# Patient Record
Sex: Male | Born: 1980 | Race: White | Hispanic: No | Smoking: Current every day smoker
Health system: Southern US, Community
[De-identification: ages and names within clinical notes are randomized; demographics above are authoritative.]

## PROBLEM LIST (undated history)

## (undated) DIAGNOSIS — F32A Depression, unspecified: Secondary | ICD-10-CM

## (undated) DIAGNOSIS — F329 Major depressive disorder, single episode, unspecified: Secondary | ICD-10-CM

## (undated) HISTORY — PX: TONSILLECTOMY: SUR1361

## (undated) HISTORY — PX: ANKLE SURGERY: SHX546

## (undated) HISTORY — PX: SPERMATOCELECTOMY: SHX2420

---

## 2001-07-15 ENCOUNTER — Other Ambulatory Visit: Admission: RE | Admit: 2001-07-15 | Discharge: 2001-07-15 | Payer: Self-pay | Admitting: Otolaryngology

## 2001-07-15 ENCOUNTER — Encounter (INDEPENDENT_AMBULATORY_CARE_PROVIDER_SITE_OTHER): Payer: Self-pay

## 2009-08-09 ENCOUNTER — Ambulatory Visit (HOSPITAL_BASED_OUTPATIENT_CLINIC_OR_DEPARTMENT_OTHER): Admission: RE | Admit: 2009-08-09 | Discharge: 2009-08-09 | Payer: Self-pay | Admitting: Urology

## 2009-08-21 ENCOUNTER — Ambulatory Visit (HOSPITAL_BASED_OUTPATIENT_CLINIC_OR_DEPARTMENT_OTHER): Admission: RE | Admit: 2009-08-21 | Discharge: 2009-08-22 | Payer: Self-pay | Admitting: Urology

## 2009-08-25 ENCOUNTER — Ambulatory Visit: Payer: Self-pay | Admitting: Internal Medicine

## 2009-08-25 ENCOUNTER — Inpatient Hospital Stay (HOSPITAL_COMMUNITY): Admission: EM | Admit: 2009-08-25 | Discharge: 2009-08-28 | Payer: Self-pay | Admitting: Emergency Medicine

## 2009-08-26 ENCOUNTER — Encounter (INDEPENDENT_AMBULATORY_CARE_PROVIDER_SITE_OTHER): Payer: Self-pay | Admitting: Internal Medicine

## 2009-08-26 ENCOUNTER — Ambulatory Visit: Payer: Self-pay | Admitting: Surgery

## 2009-08-27 ENCOUNTER — Encounter (INDEPENDENT_AMBULATORY_CARE_PROVIDER_SITE_OTHER): Payer: Self-pay | Admitting: Internal Medicine

## 2009-09-08 ENCOUNTER — Emergency Department (HOSPITAL_COMMUNITY): Admission: EM | Admit: 2009-09-08 | Discharge: 2009-09-08 | Payer: Self-pay | Admitting: Emergency Medicine

## 2009-09-25 DIAGNOSIS — I2699 Other pulmonary embolism without acute cor pulmonale: Secondary | ICD-10-CM

## 2009-09-26 ENCOUNTER — Ambulatory Visit: Payer: Self-pay | Admitting: Internal Medicine

## 2009-12-30 ENCOUNTER — Ambulatory Visit: Payer: Self-pay | Admitting: Oncology

## 2010-10-21 NOTE — Assessment & Plan Note (Signed)
Summary: Pulmonary/ new pt eval with PE   Visit Type:  Initial Consult Copy to:  Dr Allena Katz Primary Provider/Referring Provider:  Dr Allena Katz  CC:  Hx of Bilateral PE.  History of Present Illness: 40 yowm quit smoking Nov 2010 with urologic procedure x 2 complicated by bilateral PE  September 26, 2009 cc recent PE with neg venous dopplers feeling 100% including gym work. Plan is for 6 months with 6 months of coumadin.  Pt denies any significant sore throat, dysphagia, itching, sneezing,  nasal congestion or excess secretions,  fever, chills, sweats, unintended wt loss, pleuritic or exertional cp, hempoptysis, change in activity tolerance  orthopnea pnd or leg swelling    Current Medications (verified): 1)  Coumadin 5 Mg Tabs (Warfarin Sodium) .... As Directed 2)  Iron .Marland Kitchen.. 1 Three Times A Day  Allergies (verified): No Known Drug Allergies  Past History:  Past Medical History: Hx of PE (ICD-415.19)    - See CT 08/23/2009    - Echo 08/27/2009:     1. Left ventricle: The cavity size was normal.    2. Pulmonary arteries: PA peak pressure: 39mm Hg (S).   Impressions: RV is normal. Only trivial TR with just mildly elevated pulmonary     pressures by echo.  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  Quit 08/08/09.  Smoked on and off x 3 years.  Pt states that he only smoked occ. ETOH occ  Review of Systems       The patient complains of productive cough, sore throat, and change in color of mucus.  The patient denies shortness of breath with activity, shortness of breath at rest, non-productive cough, coughing up blood, chest pain, irregular heartbeats, acid heartburn, indigestion, loss of appetite, weight change, abdominal pain, difficulty swallowing, tooth/dental problems, headaches, nasal  congestion/difficulty breathing through nose, sneezing, itching, ear ache, anxiety, depression, hand/feet swelling, joint stiffness or pain, rash, and fever.    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 As directed  Orders: Consultation Level IV (41324)  Medications Added to Medication List This Visit: 1)  Coumadin 5 Mg Tabs (Warfarin sodium) .... As directed 2)  Iron  .Marland Kitchen.. 1 three times a day  Patient Instructions: 1)  This is considered provoked blood clot and one of the  risks of having surgery 2)  Recommendation is for 6 months of coumadin and then a baby aspirin daily when you finish the coumadin 3)  No further testing is necessary except for the option to check a hypercoagulable profile once you are off the coumadin.

## 2010-12-22 LAB — DIFFERENTIAL
Basophils Absolute: 0.1 10*3/uL (ref 0.0–0.1)
Basophils Relative: 1 % (ref 0–1)
Lymphocytes Relative: 26 % (ref 12–46)
Monocytes Absolute: 0.5 10*3/uL (ref 0.1–1.0)
Monocytes Relative: 6 % (ref 3–12)
Neutro Abs: 5.1 10*3/uL (ref 1.7–7.7)
Neutrophils Relative %: 65 % (ref 43–77)

## 2010-12-22 LAB — APTT: aPTT: 34 seconds (ref 24–37)

## 2010-12-22 LAB — CBC
Hemoglobin: 13.4 g/dL (ref 13.0–17.0)
MCHC: 34.3 g/dL (ref 30.0–36.0)
RBC: 4.42 MIL/uL (ref 4.22–5.81)
RDW: 13.5 % (ref 11.5–15.5)

## 2010-12-22 LAB — PROTIME-INR: INR: 2.32 — ABNORMAL HIGH (ref 0.00–1.49)

## 2010-12-23 LAB — DIFFERENTIAL
Basophils Absolute: 0 10*3/uL (ref 0.0–0.1)
Lymphocytes Relative: 10 % — ABNORMAL LOW (ref 12–46)
Lymphs Abs: 1.2 10*3/uL (ref 0.7–4.0)
Neutro Abs: 10.2 10*3/uL — ABNORMAL HIGH (ref 1.7–7.7)
Neutrophils Relative %: 80 % — ABNORMAL HIGH (ref 43–77)

## 2010-12-23 LAB — PROTIME-INR
INR: 1.15 (ref 0.00–1.49)
INR: 1.19 (ref 0.00–1.49)
INR: 1.52 — ABNORMAL HIGH (ref 0.00–1.49)
INR: 1.71 — ABNORMAL HIGH (ref 0.00–1.49)
Prothrombin Time: 14.6 seconds (ref 11.6–15.2)
Prothrombin Time: 15 seconds (ref 11.6–15.2)
Prothrombin Time: 18.2 seconds — ABNORMAL HIGH (ref 11.6–15.2)

## 2010-12-23 LAB — CBC
HCT: 34.8 % — ABNORMAL LOW (ref 39.0–52.0)
MCV: 90 fL (ref 78.0–100.0)
Platelets: 191 10*3/uL (ref 150–400)
Platelets: 206 10*3/uL (ref 150–400)
Platelets: 208 10*3/uL (ref 150–400)
RDW: 12.5 % (ref 11.5–15.5)
RDW: 12.9 % (ref 11.5–15.5)
WBC: 11.7 10*3/uL — ABNORMAL HIGH (ref 4.0–10.5)
WBC: 11.9 10*3/uL — ABNORMAL HIGH (ref 4.0–10.5)
WBC: 12.8 10*3/uL — ABNORMAL HIGH (ref 4.0–10.5)

## 2010-12-23 LAB — APTT: aPTT: 30 seconds (ref 24–37)

## 2010-12-23 LAB — BASIC METABOLIC PANEL
BUN: 13 mg/dL (ref 6–23)
CO2: 28 mEq/L (ref 19–32)
Calcium: 8.5 mg/dL (ref 8.4–10.5)
Chloride: 103 mEq/L (ref 96–112)
Creatinine, Ser: 0.95 mg/dL (ref 0.4–1.5)
Creatinine, Ser: 0.99 mg/dL (ref 0.4–1.5)
GFR calc non Af Amer: >60 mL/min (ref >60)
GFR calc non Af Amer: >60 mL/min (ref >60)
Sodium: 136 mEq/L (ref 135–145)

## 2010-12-23 LAB — POCT HEMOGLOBIN-HEMACUE: Hemoglobin: 14.2 g/dL (ref 13.0–17.0)

## 2010-12-23 LAB — HEPARIN LEVEL (UNFRACTIONATED)

## 2011-04-06 ENCOUNTER — Ambulatory Visit (HOSPITAL_COMMUNITY)
Admission: RE | Admit: 2011-04-06 | Discharge: 2011-04-06 | Disposition: A | Discharge status: Home / Self Care | Payer: 59 | Source: Ambulatory Visit | Attending: Family Medicine | Admitting: Family Medicine

## 2011-04-06 DIAGNOSIS — M79609 Pain in unspecified limb: Secondary | ICD-10-CM | POA: Insufficient documentation

## 2011-04-06 DIAGNOSIS — M7989 Other specified soft tissue disorders: Secondary | ICD-10-CM | POA: Insufficient documentation

## 2011-04-07 ENCOUNTER — Other Ambulatory Visit (HOSPITAL_COMMUNITY): Payer: Self-pay | Admitting: Family Medicine

## 2011-04-07 DIAGNOSIS — M25571 Pain in right ankle and joints of right foot: Secondary | ICD-10-CM

## 2011-04-10 ENCOUNTER — Ambulatory Visit (HOSPITAL_COMMUNITY)
Admission: RE | Admit: 2011-04-10 | Discharge: 2011-04-10 | Disposition: A | Discharge status: Home / Self Care | Payer: 59 | Source: Ambulatory Visit | Attending: Family Medicine | Admitting: Family Medicine

## 2011-04-10 DIAGNOSIS — S93439A Sprain of tibiofibular ligament of unspecified ankle, initial encounter: Secondary | ICD-10-CM | POA: Insufficient documentation

## 2011-04-10 DIAGNOSIS — X58XXXA Exposure to other specified factors, initial encounter: Secondary | ICD-10-CM | POA: Insufficient documentation

## 2011-04-10 DIAGNOSIS — M25571 Pain in right ankle and joints of right foot: Secondary | ICD-10-CM

## 2011-04-10 DIAGNOSIS — S8253XA Displaced fracture of medial malleolus of unspecified tibia, initial encounter for closed fracture: Secondary | ICD-10-CM | POA: Insufficient documentation

## 2011-04-10 DIAGNOSIS — S93429A Sprain of deltoid ligament of unspecified ankle, initial encounter: Secondary | ICD-10-CM | POA: Insufficient documentation

## 2011-07-29 ENCOUNTER — Ambulatory Visit: Payer: 59 | Attending: Orthopedic Surgery

## 2011-07-29 DIAGNOSIS — M25676 Stiffness of unspecified foot, not elsewhere classified: Secondary | ICD-10-CM | POA: Insufficient documentation

## 2011-07-29 DIAGNOSIS — R5381 Other malaise: Secondary | ICD-10-CM | POA: Insufficient documentation

## 2011-07-29 DIAGNOSIS — M25579 Pain in unspecified ankle and joints of unspecified foot: Secondary | ICD-10-CM | POA: Insufficient documentation

## 2011-07-29 DIAGNOSIS — IMO0001 Reserved for inherently not codable concepts without codable children: Secondary | ICD-10-CM | POA: Insufficient documentation

## 2011-07-29 DIAGNOSIS — M6281 Muscle weakness (generalized): Secondary | ICD-10-CM | POA: Insufficient documentation

## 2011-07-29 DIAGNOSIS — M25673 Stiffness of unspecified ankle, not elsewhere classified: Secondary | ICD-10-CM | POA: Insufficient documentation

## 2011-07-29 DIAGNOSIS — R262 Difficulty in walking, not elsewhere classified: Secondary | ICD-10-CM | POA: Insufficient documentation

## 2011-07-30 ENCOUNTER — Ambulatory Visit: Payer: 59

## 2011-08-03 ENCOUNTER — Ambulatory Visit: Payer: 59 | Admitting: Physical Therapy

## 2011-08-06 ENCOUNTER — Ambulatory Visit: Payer: 59

## 2011-08-10 ENCOUNTER — Ambulatory Visit: Payer: 59 | Admitting: Physical Therapy

## 2011-08-17 ENCOUNTER — Ambulatory Visit: Payer: 59

## 2011-08-19 ENCOUNTER — Ambulatory Visit: Payer: 59 | Admitting: Physical Therapy

## 2011-08-24 ENCOUNTER — Ambulatory Visit: Payer: 59 | Attending: Orthopedic Surgery

## 2011-08-24 DIAGNOSIS — M25579 Pain in unspecified ankle and joints of unspecified foot: Secondary | ICD-10-CM | POA: Insufficient documentation

## 2011-08-24 DIAGNOSIS — M6281 Muscle weakness (generalized): Secondary | ICD-10-CM | POA: Insufficient documentation

## 2011-08-24 DIAGNOSIS — R5381 Other malaise: Secondary | ICD-10-CM | POA: Insufficient documentation

## 2011-08-24 DIAGNOSIS — M25676 Stiffness of unspecified foot, not elsewhere classified: Secondary | ICD-10-CM | POA: Insufficient documentation

## 2011-08-24 DIAGNOSIS — IMO0001 Reserved for inherently not codable concepts without codable children: Secondary | ICD-10-CM | POA: Insufficient documentation

## 2011-08-24 DIAGNOSIS — R262 Difficulty in walking, not elsewhere classified: Secondary | ICD-10-CM | POA: Insufficient documentation

## 2011-08-24 DIAGNOSIS — M25673 Stiffness of unspecified ankle, not elsewhere classified: Secondary | ICD-10-CM | POA: Insufficient documentation

## 2011-08-26 ENCOUNTER — Ambulatory Visit: Payer: 59

## 2012-12-20 ENCOUNTER — Other Ambulatory Visit: Payer: Self-pay | Admitting: Internal Medicine

## 2013-02-03 ENCOUNTER — Other Ambulatory Visit: Payer: Self-pay | Admitting: *Deleted

## 2013-02-03 MED ORDER — MONTELUKAST SODIUM 10 MG PO TABS: ORAL_TABLET | ORAL | Status: DC

## 2014-06-27 ENCOUNTER — Encounter (HOSPITAL_COMMUNITY): Payer: Self-pay | Admitting: Emergency Medicine

## 2014-06-27 ENCOUNTER — Emergency Department (HOSPITAL_COMMUNITY)
Admission: EM | Admit: 2014-06-27 | Discharge: 2014-06-28 | Disposition: A | Discharge status: Home / Self Care | Payer: 59 | Attending: Emergency Medicine | Admitting: Emergency Medicine

## 2014-06-27 ENCOUNTER — Ambulatory Visit (HOSPITAL_COMMUNITY)
Admission: RE | Admit: 2014-06-27 | Discharge: 2014-06-27 | Disposition: A | Discharge status: Home / Self Care | Payer: 59 | Attending: Psychiatry | Admitting: Psychiatry

## 2014-06-27 DIAGNOSIS — F332 Major depressive disorder, recurrent severe without psychotic features: Secondary | ICD-10-CM | POA: Insufficient documentation

## 2014-06-27 DIAGNOSIS — R45851 Suicidal ideations: Secondary | ICD-10-CM | POA: Diagnosis present

## 2014-06-27 HISTORY — DX: Major depressive disorder, single episode, unspecified: F32.9

## 2014-06-27 HISTORY — DX: Depression, unspecified: F32.A

## 2014-06-27 LAB — COMPREHENSIVE METABOLIC PANEL
ALK PHOS: 65 U/L (ref 39–117)
ALT: 19 U/L (ref 0–53)
ANION GAP: 11 (ref 5–15)
AST: 13 U/L (ref 0–37)
Albumin: 4.4 g/dL (ref 3.5–5.2)
BILIRUBIN TOTAL: 0.3 mg/dL (ref 0.3–1.2)
BUN: 10 mg/dL (ref 6–23)
CHLORIDE: 105 meq/L (ref 96–112)
CO2: 26 mEq/L (ref 19–32)
Calcium: 9.4 mg/dL (ref 8.4–10.5)
Creatinine, Ser: 1.13 mg/dL (ref 0.50–1.35)
GFR calc non Af Amer: 83 mL/min — ABNORMAL LOW (ref >90)
GLUCOSE: 102 mg/dL — AB (ref 70–99)
POTASSIUM: 4.2 meq/L (ref 3.7–5.3)
Sodium: 142 mEq/L (ref 137–147)
Total Protein: 7.7 g/dL (ref 6.0–8.3)

## 2014-06-27 LAB — RAPID URINE DRUG SCREEN, HOSP PERFORMED
AMPHETAMINES: NOT DETECTED
BARBITURATES: NOT DETECTED
Benzodiazepines: NOT DETECTED
Cocaine: NOT DETECTED
Opiates: NOT DETECTED
Tetrahydrocannabinol: NOT DETECTED

## 2014-06-27 LAB — CBC
HCT: 46.3 % (ref 39.0–52.0)
HEMOGLOBIN: 15.5 g/dL (ref 13.0–17.0)
MCH: 29.7 pg (ref 26.0–34.0)
MCHC: 33.5 g/dL (ref 30.0–36.0)
MCV: 88.7 fL (ref 78.0–100.0)
Platelets: 152 10*3/uL (ref 150–400)
RBC: 5.22 MIL/uL (ref 4.22–5.81)
RDW: 12.8 % (ref 11.5–15.5)
WBC: 7.1 10*3/uL (ref 4.0–10.5)

## 2014-06-27 LAB — ETHANOL: Alcohol, Ethyl (B): <11 mg/dL (ref 0–11)

## 2014-06-27 LAB — SALICYLATE LEVEL: Salicylate Lvl: <2 mg/dL — ABNORMAL LOW (ref 2.8–20.0)

## 2014-06-27 LAB — ACETAMINOPHEN LEVEL

## 2014-06-27 MED ORDER — LORAZEPAM 1 MG PO TABS: 1.0000 mg | ORAL_TABLET | Freq: Three times a day (TID) | ORAL | Status: DC | PRN

## 2014-06-27 MED ORDER — ZOLPIDEM TARTRATE 5 MG PO TABS
5.0000 mg | ORAL_TABLET | Freq: Every evening | ORAL | Status: DC | PRN
  Administered 2014-06-27: 5 mg via ORAL
  Filled 2014-06-27: qty 1

## 2014-06-27 MED ORDER — NICOTINE 21 MG/24HR TD PT24
21.0000 mg | MEDICATED_PATCH | Freq: Every day | TRANSDERMAL | Status: DC
  Administered 2014-06-27 – 2014-06-28 (×2): 21 mg via TRANSDERMAL
  Filled 2014-06-27 (×2): qty 1

## 2014-06-27 MED ORDER — ALUM & MAG HYDROXIDE-SIMETH 200-200-20 MG/5ML PO SUSP: 30.0000 mL | ORAL | Status: DC | PRN

## 2014-06-27 MED ORDER — ONDANSETRON HCL 4 MG PO TABS: 4.0000 mg | ORAL_TABLET | Freq: Three times a day (TID) | ORAL | Status: DC | PRN

## 2014-06-27 MED ORDER — TRAZODONE HCL 50 MG PO TABS
50.0000 mg | ORAL_TABLET | Freq: Every evening | ORAL | Status: DC | PRN
  Administered 2014-06-27: 50 mg via ORAL
  Filled 2014-06-27: qty 1

## 2014-06-27 MED ORDER — IBUPROFEN 200 MG PO TABS: 600.0000 mg | ORAL_TABLET | Freq: Three times a day (TID) | ORAL | Status: DC | PRN

## 2014-06-27 MED ORDER — MONTELUKAST SODIUM 10 MG PO TABS
10.0000 mg | ORAL_TABLET | Freq: Every day | ORAL | Status: DC
  Administered 2014-06-27: 10 mg via ORAL
  Filled 2014-06-27 (×2): qty 1
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
## 2014-06-27 NOTE — ED Provider Notes (Signed)
CSN: [id number]     Arrival date & time 06/27/14  1623 History   First MD Initiated Contact with Patient 06/27/14 2005     Chief Complaint  Patient presents with  . Suicidal       HPI Patient presents with severe depression and poor sleep.  He states he has suicidal thoughts.  He's never carried out any of these plans.  No drug or alcohol problems.  He is not currently on any medications for his depression.  Never been hospitalized for mental health facility.  He has no other complaints at this time.   Past Medical History  Diagnosis Date  . Depression    No past surgical history on file. No family history on file. History  Substance Use Topics  . Smoking status: Not on file  . Smokeless tobacco: Not on file  . Alcohol Use: No    Review of Systems  All other systems reviewed and are negative.     Allergies  Review of patient's allergies indicates no known allergies.  Home Medications   Prior to Admission medications   Medication Sig Start Date End Date Taking? Authorizing Provider  ibuprofen (ADVIL,MOTRIN) 200 MG tablet Take 200-800 mg by mouth every 6 (six) hours as needed for moderate pain.   Yes Historical Provider, MD  montelukast (SINGULAIR) 10 MG tablet Take 10 mg by mouth at bedtime.   Yes Historical Provider, MD   BP 139/91  Pulse 70  Temp(Src) 97.7 F (36.5 C) (Oral)  Resp 20  SpO2 100% Physical Exam  Nursing note and vitals reviewed. Constitutional: He is oriented to person, place, and time. He appears well-developed and well-nourished.  HENT:  Head: Normocephalic and atraumatic.  Eyes: EOM are normal.  Neck: Normal range of motion.  Cardiovascular: Normal rate, regular rhythm, normal heart sounds and intact distal pulses.   Pulmonary/Chest: Effort normal and breath sounds normal. No respiratory distress.  Abdominal: Soft. He exhibits no distension. There is no tenderness.  Musculoskeletal: Normal range of motion.  Neurological: He is alert and  oriented to person, place, and time.  Skin: Skin is warm and dry.  Psychiatric:  Tearful.  Suicidal ideation.    ED Course  Procedures (including critical care time) Labs Review Labs Reviewed  COMPREHENSIVE METABOLIC PANEL - Abnormal; Notable for the following:    Glucose, Bld 102 (*)    GFR calc non Af Amer 83 (*)    All other components within normal limits  SALICYLATE LEVEL - Abnormal; Notable for the following:    Salicylate Lvl <2.0 (*)    All other components within normal limits  ACETAMINOPHEN LEVEL  CBC  ETHANOL  URINE RAPID DRUG SCREEN (HOSP PERFORMED)    Imaging Review No results found.   EKG Interpretation None      MDM   Final diagnoses:  None    Medically clear.  TTS.  Placement.  Sitter at bedside.  Holding orders written    Lyanne CoKevin M Artie Mcintyre, MD 06/27/14 2027
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
## 2014-06-28 DIAGNOSIS — F332 Major depressive disorder, recurrent severe without psychotic features: Secondary | ICD-10-CM

## 2014-06-28 MED ORDER — FLUOXETINE HCL 20 MG PO CAPS: 20.0000 mg | ORAL_CAPSULE | Freq: Every day | ORAL | Status: DC

## 2014-06-28 NOTE — BH Assessment (Signed)
Inpt recommended. [facility] to the following facilities [facility]   [facility]  Alejandro Romero, [place] Triage Specialist 06/28/2014 6:24 AM

## 2014-06-28 NOTE — ED Notes (Signed)
Pt discharged to home. DC instructions given. Prescriptions x 1 given for prozac. No concerns voiced. Mom called, per request, to make aware of pt's discharge. Mom is coming in to take pt to home. Pt is resting quietly in room at the moment watching television. Waiting for mom to pick him up. Vwilliams, rn.

## 2014-06-28 NOTE — BH Assessment (Addendum)
Discharge per Dr. Kellie Shropshireaylor/Shuvon Rankin, NP with follow up to the following facility:   Psychiatrists/Therapist Appointment Time and Date: July 05, 2014 @ 5:15pm Thedore MinsMojeed Akintayo, MD [address] [place], [place] [address] [phone]

## 2014-06-28 NOTE — BH Assessment (Signed)
Discharge home per Dr. Ladona Ridgelaylor and Assunta FoundShuvon Rankin, NP pending a appointment with [facility]. Left 3 voicemails attempting to make appt. Will continue trying to reach provider in order to make appt. prior to discharge.  *[facility] [phone]

## 2014-06-28 NOTE — ED Notes (Signed)
Pt discharged to home. Left unit in good condition ambulating to checkout accompanied by mother. No concerns voiced.

## 2014-06-28 NOTE — Consult Note (Signed)
[facility] Face-to-Face Psychiatry Consult   Reason for Consult:  Suicidal ideation Referring Physician:  ER MD  Gerry Blanchfield is an 33 y.o. male. Total Time spent with patient: 45 minutes  Assessment: AXIS I:  Major Depression, Recurrent severe AXIS II:  Deferred AXIS III:   Past Medical History  Diagnosis Date  . Depression    AXIS IV:  economic problems and problems related to social environment AXIS V:  51-60 moderate symptoms  Plan:  No evidence of imminent risk to self or others at present.    Subjective:   Dhanvin Szeto is a 33 y.o. male patient admitted with suicidal ideation.  HPI:  Mr Duchemin says he is depressed and has been having suicidal thoughts.  He did make an attempt last year.  This morning he says he is still depressed but not suicidal at this moment.  He believes he will get better with outpatient therapy and medication which helped him in the past. Having time to think without distraction and also getting sleep helped clear his mind and he believes he would rather get outpatient help rather than inpatient.  His mother came in for part of the interview and she was supportive of him coming home with her.  He recently moved in with her.  The separation from his wife and relationship issues with a current girlfriend have caused stress.  He is not using drugs or alcohol.  He works second shift and has no social life and has insomnia which has been present for a long time.  He has a new job coming up in December which is more promising than his current one and says things are looking up but he just cannot shake the depression. HPI Elements:   Location:  depression. Quality:  some suicidal thoughts. Severity:  does not plan to act on these thoughts he says. Timing:  break up with his wife . Duration:  6 months. Context:  as above.  Past Psychiatric History: Past Medical History  Diagnosis Date  . Depression     reports that he does not drink alcohol or use illicit drugs. His  tobacco history is not on file. No family history on file. Family History Substance Abuse: No Family Supports: Yes, List: (mom, sisters) Living Arrangements: Parent Can pt return to current living arrangement?: Yes   Allergies:  No Known Allergies  ACT Assessment Complete:  Yes:    Educational Status    Risk to Self: Risk to self with the past 6 months Suicidal Ideation: Yes-Currently Present Suicidal Intent: No Is patient at risk for suicide?: Yes Suicidal Plan?: No (denies intent but thinks about shooting himself or OD) Access to Means: Yes Specify Access to Suicidal Means: owns several firearms, access to pills (pt also mentioned slamming tree into car) What has been your use of drugs/alcohol within the last 12 months?: none Previous Attempts/Gestures: Yes How many times?: 1 (put gun in mouth, then ingested bottle of sleeping pillsetoh) Other Self Harm Risks: none Triggers for Past Attempts: Spouse contact;Other (Comment) Intentional Self Injurious Behavior: None Family Suicide History: No (non blood cousin committed suicide) Recent stressful life event(s): Conflict (Comment);Other (Comment) (depressive sxs,divorce, current g/f won't talk to him) Persecutory voices/beliefs?: No Depression: Yes Depression Symptoms: Despondent;Insomnia;Tearfulness;Isolating;Fatigue;Loss of interest in usual pleasures;Feeling worthless/self pity Substance abuse history and/or treatment for substance abuse?: No Suicide prevention information given to non-admitted patients: Not applicable  Risk to Others: Risk to Others within the past 6 months Homicidal Ideation: No Thoughts of Harm  to Others: No Current Homicidal Intent: No Current Homicidal Plan: No Access to Homicidal Means: No Identified Victim: none History of harm to others?: No Assessment of Violence: None Noted Violent Behavior Description: pt calm - denies hx violence Does patient have access to weapons?: Yes (Comment) (pt sts owns  many firearms & concealed permit) Criminal Charges Pending?: No Does patient have a court date: No  Abuse:    Prior Inpatient Therapy: Prior Inpatient Therapy Prior Inpatient Therapy: No Prior Therapy Dates: na Prior Therapy Facilty/Provider(s): na Reason for Treatment: na  Prior Outpatient Therapy: Prior Outpatient Therapy Prior Outpatient Therapy: Yes Prior Therapy Dates: currently Prior Therapy Facilty/Provider(s): [facility] at provider in [place]? Reason for Treatment: depression, divorce  Additional Information: Additional Information 1:1 In Past 12 Months?: No CIRT Risk: No Elopement Risk: No Does patient have medical clearance?: No                  Objective: Blood pressure 107/62, pulse 73, temperature 97.8 F (36.6 C), temperature source Oral, resp. rate 18, SpO2 99.00%.There is no weight on file to calculate BMI. Results for orders placed during the hospital encounter of 06/27/14 (from the past 72 hour(s))  ACETAMINOPHEN LEVEL     Status: None   Collection Time    06/27/14  4:48 PM      Result Value Ref Range   Acetaminophen (Tylenol), Serum <15.0  10 - 30 ug/mL   Comment:            THERAPEUTIC CONCENTRATIONS VARY     SIGNIFICANTLY. A RANGE OF 10-30     ug/mL MAY BE AN EFFECTIVE     CONCENTRATION FOR MANY PATIENTS.     HOWEVER, SOME ARE BEST TREATED     AT CONCENTRATIONS OUTSIDE THIS     RANGE.     ACETAMINOPHEN CONCENTRATIONS     >150 ug/mL AT 4 HOURS AFTER     INGESTION AND >50 ug/mL AT 12     HOURS AFTER INGESTION ARE     OFTEN ASSOCIATED WITH TOXIC     REACTIONS.  CBC     Status: None   Collection Time    06/27/14  4:48 PM      Result Value Ref Range   WBC 7.1  4.0 - 10.5 K/uL   RBC 5.22  4.22 - 5.81 MIL/uL   Hemoglobin 15.5  13.0 - 17.0 g/dL   HCT 46.3  39.0 - 52.0 %   MCV 88.7  78.0 - 100.0 fL   MCH 29.7  26.0 - 34.0 pg   MCHC 33.5  30.0 - 36.0 g/dL   RDW 12.8  11.5 - 15.5 %   Platelets 152  150 - 400 K/uL   COMPREHENSIVE METABOLIC PANEL     Status: Abnormal   Collection Time    06/27/14  4:48 PM      Result Value Ref Range   Sodium 142  137 - 147 mEq/L   Potassium 4.2  3.7 - 5.3 mEq/L   Chloride 105  96 - 112 mEq/L   CO2 26  19 - 32 mEq/L   Glucose, Bld 102 (*) 70 - 99 mg/dL   BUN 10  6 - 23 mg/dL   Creatinine, Ser 1.13  0.50 - 1.35 mg/dL   Calcium 9.4  8.4 - 10.5 mg/dL   Total Protein 7.7  6.0 - 8.3 g/dL   Albumin 4.4  3.5 - 5.2 g/dL   AST 13  0 -  37 U/L   ALT 19  0 - 53 U/L   Alkaline Phosphatase 65  39 - 117 U/L   Total Bilirubin 0.3  0.3 - 1.2 mg/dL   GFR calc non Af Amer 83 (*) >90 mL/min   GFR calc Af Amer >90  >90 mL/min   Comment: (NOTE)     The eGFR has been calculated using the CKD EPI equation.     This calculation has not been validated in all clinical situations.     eGFR's persistently <90 mL/min signify possible Chronic Kidney     Disease.   Anion gap 11  5 - 15  ETHANOL     Status: None   Collection Time    06/27/14  4:48 PM      Result Value Ref Range   Alcohol, Ethyl (B) <11  0 - 11 mg/dL   Comment:            LOWEST DETECTABLE LIMIT FOR     SERUM ALCOHOL IS 11 mg/dL     FOR MEDICAL PURPOSES ONLY  SALICYLATE LEVEL     Status: Abnormal   Collection Time    06/27/14  4:48 PM      Result Value Ref Range   Salicylate Lvl <6.7 (*) 2.8 - 20.0 mg/dL  URINE RAPID DRUG SCREEN (HOSP PERFORMED)     Status: None   Collection Time    06/27/14  4:54 PM      Result Value Ref Range   Opiates NONE DETECTED  NONE DETECTED   Cocaine NONE DETECTED  NONE DETECTED   Benzodiazepines NONE DETECTED  NONE DETECTED   Amphetamines NONE DETECTED  NONE DETECTED   Tetrahydrocannabinol NONE DETECTED  NONE DETECTED   Barbiturates NONE DETECTED  NONE DETECTED   Comment:            DRUG SCREEN FOR MEDICAL PURPOSES     ONLY.  IF CONFIRMATION IS NEEDED     FOR ANY PURPOSE, NOTIFY LAB     WITHIN 5 DAYS.                LOWEST DETECTABLE LIMITS     FOR URINE DRUG SCREEN     Drug  Class       Cutoff (ng/mL)     Amphetamine      1000     Barbiturate      200     Benzodiazepine   619     Tricyclics       509     Opiates          300     Cocaine          300     THC              50   Labs are reviewed and are pertinent for no psychiatric issues.  Current Facility-Administered Medications  Medication Dose Route Frequency Provider Last Rate Last Dose  . alum & mag hydroxide-simeth (MAALOX/MYLANTA) 200-200-20 MG/5ML suspension 30 mL  30 mL Oral PRN Hoy Morn, MD      . ibuprofen (ADVIL,MOTRIN) tablet 600 mg  600 mg Oral Q8H PRN Hoy Morn, MD      . LORazepam (ATIVAN) tablet 1 mg  1 mg Oral Q8H PRN Hoy Morn, MD      . montelukast (SINGULAIR) tablet 10 mg  10 mg Oral QHS Hoy Morn, MD   10 mg at 06/27/14 2123  . nicotine (  NICODERM CQ - dosed in mg/24 hours) patch 21 mg  21 mg Transdermal Daily Hoy Morn, MD   21 mg at 06/28/14 1045  . ondansetron (ZOFRAN) tablet 4 mg  4 mg Oral Q8H PRN Hoy Morn, MD      . traZODone (DESYREL) tablet 50 mg  50 mg Oral QHS PRN Hoy Morn, MD   50 mg at 06/27/14 2123  . zolpidem (AMBIEN) tablet 5 mg  5 mg Oral QHS PRN Hoy Morn, MD   5 mg at 06/27/14 2224   Current Outpatient Prescriptions  Medication Sig Dispense Refill  . ibuprofen (ADVIL,MOTRIN) 200 MG tablet Take 200-800 mg by mouth every 6 (six) hours as needed for moderate pain.      . montelukast (SINGULAIR) 10 MG tablet Take 10 mg by mouth at bedtime.        Psychiatric Specialty Exam:     Blood pressure 107/62, pulse 73, temperature 97.8 F (36.6 C), temperature source Oral, resp. rate 18, SpO2 99.00%.There is no weight on file to calculate BMI.  General Appearance: Casual  Eye Contact::  Good  Speech:  Clear and Coherent  Volume:  Normal  Mood:  Depressed  Affect:  Congruent  Thought Process:  Coherent and Logical  Orientation:  Full (Time, Place, and Person)  Thought Content:  Negative  Suicidal Thoughts:  No  Homicidal  Thoughts:  No  Memory:  Immediate;   Good Recent;   Good Remote;   Good  Judgement:  Intact  Insight:  Fair  Psychomotor Activity:  Normal  Concentration:  Good  Recall:  Good  Fund of Knowledge:Good  Language: Good  Akathisia:  Negative  Handed:  Right  AIMS (if indicated):     Assets:  Communication Skills Desire for Improvement Financial Resources/Insurance Housing Leisure Time [place] Talents/Skills Transportation Vocational/Educational  Sleep:      Musculoskeletal: Strength & Muscle Tone: within normal limits Gait & Station: normal Patient leans: N/A  Treatment Plan Summary: discharge home to be folowed by his previous therapist in [place] and Dr Darleene Cleaver for medications  TAYLOR,GERALD D 06/28/2014 1:23 PM

## 2014-06-28 NOTE — [facility] Suicide Risk Assessment (Signed)
Suicide Risk Assessment  Discharge Assessment     Demographic Factors:  Male, Adolescent or young adult and Caucasian  Total Time spent with patient: 45 minutes  Psychiatric Specialty Exam:     Blood pressure 107/62, pulse 73, temperature 97.8 F (36.6 C), temperature source Oral, resp. rate 18, SpO2 99.00%.There is no weight on file to calculate BMI.  General Appearance: Fairly Groomed  Patent attorneyye Contact::  Good  Speech:  Clear and Coherent  Volume:  Normal  Mood:  Depressed  Affect:  Depressed  Thought Process:  Coherent and Logical  Orientation:  Full (Time, Place, and Person)  Thought Content:  Negative  Suicidal Thoughts:  No  Homicidal Thoughts:  No  Memory:  Immediate;   Good Recent;   Good Remote;   Good  Judgement:  Intact  Insight:  Fair  Psychomotor Activity:  Normal  Concentration:  Good  Recall:  Good  Fund of Knowledge:Good  Language: Good  Akathisia:  Negative  Handed:  Right  AIMS (if indicated):     Assets:  Communication Skills Desire for Improvement Financial Resources/Insurance Housing Leisure Time Physical Health Resilience Social Support Talents/Skills Transportation Vocational/Educational  Sleep:   poor    Musculoskeletal: Strength & Muscle Tone: within normal limits Gait & Station: normal Patient leans: N/A   Mental Status Per Nursing Assessment::   On Admission:     Current Mental Status by Physician: NA  Loss Factors: NA  Historical Factors: Prior suicide attempts  Risk Reduction Factors:   Sense of responsibility to family, Employed, Living with another person, especially a relative, Positive social support, Positive therapeutic relationship and Positive coping skills or problem solving skills  Continued Clinical Symptoms:  depression  Cognitive Features That Contribute To Risk:  Closed-mindedness    Suicide Risk:  Minimal: No identifiable suicidal ideation.  Patients presenting with no risk factors but with morbid  ruminations; may be classified as minimal risk based on the severity of the depressive symptoms  Discharge Diagnoses:   AXIS I:  Major Depression, Recurrent severe AXIS II:  Deferred AXIS III:   Past Medical History  Diagnosis Date  . Depression    AXIS IV:  problems related to social environment AXIS V:  51-60 moderate symptoms  Plan Of Care/Follow-up recommendations:  Activity:  resume usual activity Diet:  resume usual diet  Is patient on multiple antipsychotic therapies at discharge:  No   Has Patient had three or more failed trials of antipsychotic monotherapy by history:  No  Recommended Plan for Multiple Antipsychotic Therapies: NA    TAYLOR,GERALD D 06/28/2014, 1:47 PM

## 2015-11-21 ENCOUNTER — Other Ambulatory Visit: Payer: Self-pay | Admitting: Physician Assistant

## 2015-11-21 ENCOUNTER — Ambulatory Visit
Admission: RE | Admit: 2015-11-21 | Discharge: 2015-11-21 | Disposition: A | Discharge status: Home / Self Care | Payer: 59 | Source: Ambulatory Visit | Attending: Physician Assistant | Admitting: Physician Assistant

## 2015-11-21 DIAGNOSIS — M5489 Other dorsalgia: Secondary | ICD-10-CM

## 2016-06-04 ENCOUNTER — Ambulatory Visit
Admission: RE | Admit: 2016-06-04 | Discharge: 2016-06-04 | Disposition: A | Discharge status: Home / Self Care | Payer: 59 | Source: Ambulatory Visit | Attending: Physician Assistant | Admitting: Physician Assistant

## 2016-06-04 ENCOUNTER — Other Ambulatory Visit: Payer: Self-pay | Admitting: Physician Assistant

## 2016-06-04 DIAGNOSIS — R0602 Shortness of breath: Secondary | ICD-10-CM

## 2016-06-18 ENCOUNTER — Encounter: Payer: Self-pay | Admitting: Neurology

## 2016-06-18 ENCOUNTER — Ambulatory Visit (INDEPENDENT_AMBULATORY_CARE_PROVIDER_SITE_OTHER): Payer: 59 | Admitting: Neurology

## 2016-06-18 VITALS — HR 87 | Ht >= 80 in | Wt 290.0 lb

## 2016-06-18 DIAGNOSIS — R519 Headache, unspecified: Secondary | ICD-10-CM

## 2016-06-18 DIAGNOSIS — R51 Headache: Secondary | ICD-10-CM

## 2016-06-18 MED ORDER — DICLOFENAC POTASSIUM(MIGRAINE) 50 MG PO PACK: PACK | ORAL | 0 refills | Status: DC

## 2016-06-18 MED ORDER — SUMATRIPTAN-NAPROXEN SODIUM 85-500 MG PO TABS: 1.0000 | ORAL_TABLET | ORAL | 0 refills | Status: DC | PRN

## 2016-06-18 MED ORDER — ZOLMITRIPTAN 5 MG NA SOLN: NASAL | 0 refills | Status: DC

## 2016-06-18 NOTE — Progress Notes (Signed)
NEUROLOGY CONSULTATION NOTE  [name] MRN: [id number] DOB: 03-Dec-1980  Referring provider: Dr. Lupita Raider Primary care provider: Dr. Lupita Raider   Reason for consult:  Headache x 2 weeks  Dear Dr [name]:  Thank you for your kind referral of Alejandro Romero for consultation of the above symptoms. Although his history is well known to you, please allow me to reiterate it for the purpose of our medical record. Records and images were personally reviewed where available.  HISTORY OF PRESENT ILLNESS: This is a pleasant 35 year old right-handed man with no significant past medical history presenting for a 3-week history of constant headaches. He reports having on and off headaches for the past 2 years where he would stay in a dark room occurring every 2-3 months, usually relieved with Excedrin, however this current headache came on suddenly while at work, 10/10 with pressure over the bilateral temporal and occipital regions. He was in bed for 5 days and vomited the first day. For two weeks the pain was pretty steady, then this week it would wax and wane, currently a 5/10 but now with quick sharp bursts of 10/10 pain that would make him feel he would drop to his knees and his whole head is about to explode. Pressing on his temples seems to help. He has been sensitive to lights and sounds and wears dark glasses and earplugs. He has not been sleeping well due to the pain, getting 2-3 hours of sleep intermittently. He has tried Excedrin, Tylenol, Advil, Aleve, and given Relpax and Flexeril with minimal relief. He denies any increased stress. His vision occasionally gets blurred with the sharp pains, he sees occasional flashing lights. No tinnitus. He has chronic neck and back pain. No focal numbness/tingling/weakness, no bowel/bladder dysfunction. No family history of headaches.   PAST MEDICAL HISTORY: Past Medical History:  Diagnosis Date  . Depression     PAST SURGICAL HISTORY: Past  Surgical History:  Procedure Laterality Date  . ANKLE SURGERY    . SPERMATOCELECTOMY    . TONSILLECTOMY      MEDICATIONS: No current outpatient prescriptions on file prior to visit.   No current facility-administered medications on file prior to visit.     ALLERGIES: No Known Allergies  FAMILY HISTORY: History reviewed. No pertinent family history.  SOCIAL HISTORY: Social History   Social History  . Marital status: Married    Spouse name: N/A  . Number of children: N/A  . Years of education: N/A   Occupational History  . Not on file.   Social History Main Topics  . Smoking status: Former Games developer  . Smokeless tobacco: Never Used  . Alcohol use No  . Drug use: No  . Sexual activity: Not on file   Other Topics Concern  . Not on file   Social History Narrative  . No narrative on file    REVIEW OF SYSTEMS: Constitutional: No fevers, chills, or sweats, no generalized fatigue, change in appetite Eyes: No visual changes, double vision, eye pain Ear, nose and throat: No hearing loss, ear pain, nasal congestion, sore throat Cardiovascular: No chest pain, palpitations Respiratory:  No shortness of breath at rest or with exertion, wheezes GastrointestinaI: No nausea, vomiting, diarrhea, abdominal pain, fecal incontinence Genitourinary:  No dysuria, urinary retention or frequency Musculoskeletal:  + neck pain, back pain Integumentary: No rash, pruritus, skin lesions Neurological: as above Psychiatric: No depression, insomnia, anxiety Endocrine: No palpitations, fatigue, diaphoresis, mood swings, change in appetite, change in weight, increased thirst  Hematologic/Lymphatic:  No anemia, purpura, petechiae. Allergic/Immunologic: no itchy/runny eyes, nasal congestion, recent allergic reactions, rashes  PHYSICAL EXAM: Vitals:   06/18/16 1402  Pulse: 87   General: No acute distress Head:  Normocephalic/atraumatic Eyes: Fundoscopic exam shows bilateral sharp discs, no  vessel changes, exudates, or hemorrhages Neck: supple, no paraspinal tenderness, full range of motion Back: No paraspinal tenderness Heart: regular rate and rhythm Lungs: Clear to auscultation bilaterally. Vascular: No carotid bruits. Skin/Extremities: No rash, no edema Neurological Exam: Mental status: alert and oriented to person, place, and time, no dysarthria or aphasia, Fund of knowledge is appropriate.  Recent and remote memory are intact.  Attention and concentration are normal.    Able to name objects and repeat phrases. Cranial nerves: CN I: not tested CN II: pupils equal, round and reactive to light, visual fields intact, fundi unremarkable. CN III, IV, VI:  full range of motion, no nystagmus, no ptosis CN V: facial sensation intact CN VII: upper and lower face symmetric CN VIII: hearing intact to finger rub CN IX, X: gag intact, uvula midline CN XI: sternocleidomastoid and trapezius muscles intact CN XII: tongue midline Bulk & Tone: normal, no fasciculations. Motor: 5/5 throughout with no pronator drift. Sensation: intact to light touch, cold, pin, vibration and joint position sense.  No extinction to double simultaneous stimulation.  Romberg test negative Deep Tendon Reflexes: +2 throughout, no ankle clonus Plantar responses: downgoing bilaterally Cerebellar: no incoordination on finger to nose, heel to shin. No dysdiadochokinesia Gait: narrow-based and steady, able to tandem walk adequately. Tremor: none  IMPRESSION: This is a pleasant 35 year old right-handed man presenting with a 2-3 year history of intermittent headaches, that have been constant and unrelenting over the past 3 weeks. He vomited the first day, and has been having stabbing severe sharp pains intermittently the past week. Although there are some migrainous components, with change in headache character, MRI brain without contrast will be ordered to assess for underlying structural abnormality. He was given a  prescription for a 1-week course of prednisone to hopefully break the headache cycle. He will start nortriptyline for headache prophylaxis, with uptitration as tolerated, side effects were discussed. He was also given samples of Cambia, Zomig, and Treximet to try separately, he will let us know which is more effective and refills will be sent in. He would like to have the headache cocktail but will come back when he has a driver. There may be a component of medication overuse as well, he knows to minimize over the counter pain medication to 2-3 a week to avoid rebound headaches. He will follow-up in 6 weeks and knows to call for any changes.   Thank you for allowing me to participate in the care of this patient. Please do not hesitate to call for any questions or concerns.   Patrcia DollyKaren Casha Estupinan, M.D.  CC: Dr. Clelia CroftShaw

## 2016-06-18 NOTE — Patient Instructions (Addendum)
1. Schedule MRI brain without contrast 2. Start Prednisone 20mg  in the morning: Take 3 tabs on day 1, 2-1/2 tabs on day 2, 2 tabs on day 3, 1-1/2 tabs on day 4, 1 tab on day 5, 1/2 tab on days 6 and 7, then stop 3. Start nortriptyline 10mg : Take 1 tablet at night for 1 week, then increase to 2 tablets at night and continue 4. Come for headache injections tomorrow 5. Try samples for rescue medication instead of Excedrin and let us know which is helpful so we can send refills 6. Minimize any over the counter pain medication to 2-3 times a week, otherwise headaches can worsen 7. Follow-up in 6 weeks, call for any changes

## 2016-06-19 ENCOUNTER — Ambulatory Visit (INDEPENDENT_AMBULATORY_CARE_PROVIDER_SITE_OTHER): Payer: 59 | Admitting: Neurology

## 2016-06-19 ENCOUNTER — Other Ambulatory Visit: Payer: Self-pay | Admitting: Neurology

## 2016-06-19 ENCOUNTER — Telehealth: Payer: Self-pay | Admitting: Neurology

## 2016-06-19 DIAGNOSIS — R51 Headache: Secondary | ICD-10-CM

## 2016-06-19 DIAGNOSIS — R519 Headache, unspecified: Secondary | ICD-10-CM

## 2016-06-19 MED ORDER — NORTRIPTYLINE HCL 10 MG PO CAPS: ORAL_CAPSULE | ORAL | 6 refills | Status: DC

## 2016-06-19 MED ORDER — DIPHENHYDRAMINE HCL 50 MG/ML IJ SOLN
25.0000 mg | Freq: Once | INTRAMUSCULAR | Status: AC
  Administered 2016-06-19: 25 mg via INTRAMUSCULAR

## 2016-06-19 MED ORDER — KETOROLAC TROMETHAMINE 60 MG/2ML IM SOLN
60.0000 mg | Freq: Once | INTRAMUSCULAR | Status: AC
  Administered 2016-06-19: 60 mg via INTRAMUSCULAR

## 2016-06-19 MED ORDER — PREDNISONE 20 MG PO TABS: ORAL_TABLET | ORAL | 0 refills | Status: DC

## 2016-06-19 MED ORDER — METOCLOPRAMIDE HCL 5 MG/ML IJ SOLN
10.0000 mg | Freq: Once | INTRAVENOUS | Status: AC
  Administered 2016-06-19: 10 mg via INTRAMUSCULAR

## 2016-06-19 NOTE — Telephone Encounter (Signed)
Pt's notified rx's were sent to correct pharmacy.

## 2016-06-19 NOTE — Telephone Encounter (Signed)
Alejandro RamalDustin Romero 01-10-1981. He was seen yesterday by Dr. Karel JarvisAquino. He needed his medications (2) sent to [facility] on  [address]. [facility] but he no longer uses them.  He is also on his way today for a shot for his migraines. He does have a driver with him. Thank you

## 2016-06-19 NOTE — Progress Notes (Signed)
Headache cocktail injection to left ventrogluteal with no apparent complications.

## 2016-06-26 ENCOUNTER — Ambulatory Visit
Admission: RE | Admit: 2016-06-26 | Discharge: 2016-06-26 | Disposition: A | Discharge status: Home / Self Care | Payer: 59 | Source: Ambulatory Visit | Attending: Neurology | Admitting: Neurology

## 2016-06-26 DIAGNOSIS — R51 Headache: Principal | ICD-10-CM

## 2016-06-26 DIAGNOSIS — R519 Headache, unspecified: Secondary | ICD-10-CM

## 2016-06-30 ENCOUNTER — Telehealth: Payer: Self-pay

## 2016-06-30 NOTE — Telephone Encounter (Signed)
Pls let him know the MRI brain is normal, no evidence of tumor, stroke, or bleed. He is on a low dose of the medication, would continue increasing, start taking 3 tablets at bedtime for 1 week, then increase to 4 tablets at bedtime. Thanks

## 2016-06-30 NOTE — Telephone Encounter (Signed)
Patient called and said he's still having headaches. He wants to know if there is a stronger medication he can be put on for them? He also asked how long it would be before we had his MRI results.

## 2016-07-01 NOTE — Telephone Encounter (Signed)
Patient notified of MRI results. Also advised him of increase of medication. Pt verbalized understanding.

## 2016-07-14 ENCOUNTER — Telehealth: Payer: Self-pay

## 2016-07-14 NOTE — Telephone Encounter (Signed)
Patient called stated he needed refill sent on Nortiptyline 10mg  4 tabs at night. RX called in to pharmacy.

## 2016-07-31 ENCOUNTER — Encounter: Payer: Self-pay | Admitting: Neurology

## 2016-07-31 ENCOUNTER — Ambulatory Visit (INDEPENDENT_AMBULATORY_CARE_PROVIDER_SITE_OTHER): Payer: 59 | Admitting: Neurology

## 2016-07-31 VITALS — BP 118/86 | HR 74 | Ht >= 80 in | Wt 282.0 lb

## 2016-07-31 DIAGNOSIS — R51 Headache: Secondary | ICD-10-CM | POA: Diagnosis not present

## 2016-07-31 DIAGNOSIS — R0683 Snoring: Secondary | ICD-10-CM

## 2016-07-31 DIAGNOSIS — R519 Headache, unspecified: Secondary | ICD-10-CM

## 2016-07-31 DIAGNOSIS — G43009 Migraine without aura, not intractable, without status migrainosus: Secondary | ICD-10-CM | POA: Diagnosis not present

## 2016-07-31 MED ORDER — NORTRIPTYLINE HCL 25 MG PO CAPS: ORAL_CAPSULE | ORAL | 5 refills | Status: AC

## 2016-07-31 MED ORDER — SUMATRIPTAN-NAPROXEN SODIUM 85-500 MG PO TABS: ORAL_TABLET | ORAL | 5 refills | Status: AC

## 2016-07-31 NOTE — Patient Instructions (Addendum)
1. Schedule sleep study 2. Increase nortriptyline to 50mg . With your current prescription of nortriptyline 10mg , take 5 capsules at night. After you are done with this bottle, your new prescription will be for nortriptyline 25mg , take 2 capsules at night. After a week, increase to 3 capsules at night. 3. Take Treximet as needed for headache. Do not take more than 2-3 a week, as this can worsen headaches 4. Discuss allergies/congestion with Dr. Clelia CroftShaw 5. Keep a calendar of your headaches, follow-up in 3 months

## 2016-07-31 NOTE — Progress Notes (Signed)
NEUROLOGY FOLLOW UP OFFICE NOTE  Alejandro RamalDustin Mcraney [id number]  HISTORY OF PRESENT ILLNESS: I had the pleasure of seeing Alejandro Romero in follow-up in the neurology clinic on 07/31/2016.  The patient was last seen 6 weeks ago for 3 weeks of constant headaches. Records and images were personally reviewed where available.  I personally reviewed MRI brain without contrast which did not show any acute changes. He was started on nortriptyline, currently at 40mg  qhs. He denies any side effects. He reports that he is not having the headaches on a daily basis anymore, however still has headaches 4 out of 7 days a week. He would wake up several times throughout the night due to a headache, and wakes up with a headaches, getting better as the day goes on. He denies any dizziness, vision changes, focal numbness/tingling/weakness. No further nausea/vomiting. He does snore at night, and feels tired during the day. He has also noticed nasal congestion at night, he is definitely more stuffy at night, even if he takes Singulair nightly. He was given samples for Treximet, Zomig, and Cambia. Zomig and Cambia were ineffective, he feels Treximet may have helped.  HPI 06/18/2016: This is a pleasant 35 yo RH man with no significant past medical history who presented with a 3-week history of constant headaches. He reports having on and off headaches for the past 2 years where he would stay in a dark room occurring every 2-3 months, usually relieved with Excedrin, however this current headache came on suddenly while at work, 10/10 with pressure over the bilateral temporal and occipital regions. He was in bed for 5 days and vomited the first day. For two weeks the pain was pretty steady, then this week it would wax and wane, currently a 5/10 but now with quick sharp bursts of 10/10 pain that would make him feel he would drop to his knees and his whole head is about to explode. Pressing on his temples seems to help. He has been sensitive to  lights and sounds and wears dark glasses and earplugs. He has not been sleeping well due to the pain, getting 2-3 hours of sleep intermittently. He has tried Excedrin, Tylenol, Advil, Aleve, and given Relpax and Flexeril with minimal relief. He denies any increased stress. His vision occasionally gets blurred with the sharp pains, he sees occasional flashing lights. No tinnitus. He has chronic neck and back pain. No focal numbness/tingling/weakness, no bowel/bladder dysfunction. No family history of headaches.  PAST MEDICAL HISTORY: Past Medical History:  Diagnosis Date  . Depression     MEDICATIONS: Current Outpatient Prescriptions on File Prior to Visit  Medication Sig Dispense Refill  . Diclofenac Potassium (CAMBIA) 50 MG PACK As directed 1 each 0  . nortriptyline (PAMELOR) 10 MG capsule Take 1 capsule at night for 1 week, then increase to 2 caps at night 60 capsule 6  . SUMAtriptan-naproxen (TREXIMET) 85-500 MG tablet Take 1 tablet by mouth every 2 (two) hours as needed for migraine. 10 tablet 0  . zolmitriptan (ZOMIG) 5 MG nasal solution As directed 1 Units 0   No current facility-administered medications on file prior to visit.     ALLERGIES: No Known Allergies  FAMILY HISTORY: No family history on file.  SOCIAL HISTORY: Social History   Social History  . Marital status: Married    Spouse name: N/A  . Number of children: N/A  . Years of education: N/A   Occupational History  . Not on file.   Social History Main  Topics  . Smoking status: Former Games developermoker  . Smokeless tobacco: Never Used  . Alcohol use No  . Drug use: No  . Sexual activity: Not on file   Other Topics Concern  . Not on file   Social History Narrative  . No narrative on file    REVIEW OF SYSTEMS: Constitutional: No fevers, chills, or sweats, no generalized fatigue, change in appetite Eyes: No visual changes, double vision, eye pain Ear, nose and throat: No hearing loss, ear pain, nasal congestion,  sore throat Cardiovascular: No chest pain, palpitations Respiratory:  No shortness of breath at rest or with exertion, wheezes GastrointestinaI: No nausea, vomiting, diarrhea, abdominal pain, fecal incontinence Genitourinary:  No dysuria, urinary retention or frequency Musculoskeletal:  + neck pain, back pain Integumentary: No rash, pruritus, skin lesions Neurological: as above Psychiatric: No depression, insomnia, anxiety Endocrine: No palpitations, fatigue, diaphoresis, mood swings, change in appetite, change in weight, increased thirst Hematologic/Lymphatic:  No anemia, purpura, petechiae. Allergic/Immunologic: no itchy/runny eyes, nasal congestion, recent allergic reactions, rashes  PHYSICAL EXAM: Vitals:   07/31/16 1301  BP: 118/86  Pulse: 74   General: No acute distress Head:  Normocephalic/atraumatic Neck: supple, no paraspinal tenderness, full range of motion Heart:  Regular rate and rhythm Lungs:  Clear to auscultation bilaterally Back: No paraspinal tenderness Skin/Extremities: No rash, no edema Neurological Exam: alert and oriented to person, place, and time. No aphasia or dysarthria. Fund of knowledge is appropriate.  Recent and remote memory are intact.  Attention and concentration are normal.    Able to name objects and repeat phrases. Cranial nerves: Pupils equal, round, reactive to light.  Fundoscopic exam unremarkable, no papilledema. Extraocular movements intact with no nystagmus. Visual fields full. Facial sensation intact. No facial asymmetry. Tongue, uvula, palate midline.  Motor: Bulk and tone normal, muscle strength 5/5 throughout with no pronator drift.  Sensation to light touch intact.  No extinction to double simultaneous stimulation.  Deep tendon reflexes 2+ throughout, toes downgoing.  Finger to nose testing intact.  Gait narrow-based and steady, able to tandem walk adequately.  Romberg negative.  IMPRESSION: This is a pleasant 35 yo RH man with a 2-3 year  history of intermittent headaches, who presented with a 3-week history of constant and unrelenting headaches. There are migrainous components to his headaches. His MRI brain is normal. We discussed medication management of headaches, he will increase nortriptyline to 50mg  qhs x 1 week, then 75mg  qhs. He felt Treximet helped, refills were sent. He is reporting more headaches at night, waking him up. A sleep study will be ordered to assess for sleep apnea, which could cause morning headaches. He is also reporting nasal congestion at night, and will discuss further treatment with his PCP, as this may be contributing as well. He will keep a calendar of his headaches and follow-up in 3 months.   Thank you for allowing me to participate in his care.  Please do not hesitate to call for any questions or concerns.  The duration of this appointment visit was 25 minutes of face-to-face time with the patient.  Greater than 50% of this time was spent in counseling, explanation of diagnosis, planning of further management, and coordination of care.   Patrcia DollyKaren Aquino, M.D.   CC: Dr. Clelia CroftShaw

## 2016-07-31 NOTE — Addendum Note (Signed)
Addended by: Rosalyn GessHARMAN, Neeley Sedivy J on: 07/31/2016 01:48 PM   Modules accepted: Orders

## 2016-08-25 ENCOUNTER — Telehealth: Payer: Self-pay

## 2016-08-25 NOTE — Telephone Encounter (Signed)
[facility] to see if [facility] had been scheduled. They are going to call and schedule.

## 2016-08-28 ENCOUNTER — Other Ambulatory Visit: Payer: Self-pay

## 2016-08-28 DIAGNOSIS — R51 Headache: Principal | ICD-10-CM

## 2016-08-28 DIAGNOSIS — R0683 Snoring: Secondary | ICD-10-CM

## 2016-08-28 DIAGNOSIS — R519 Headache, unspecified: Secondary | ICD-10-CM

## 2016-10-30 ENCOUNTER — Ambulatory Visit: Payer: Self-pay | Admitting: Neurology

## 2017-09-24 ENCOUNTER — Ambulatory Visit: Payer: 59 | Admitting: Psychology

## 2017-12-20 ENCOUNTER — Other Ambulatory Visit: Payer: Self-pay | Admitting: Neurology

## 2018-02-07 ENCOUNTER — Other Ambulatory Visit: Payer: Self-pay | Admitting: Neurology

## 2019-12-14 ENCOUNTER — Ambulatory Visit
Admission: RE | Admit: 2019-12-14 | Discharge: 2019-12-14 | Disposition: A | Discharge status: Home / Self Care | Payer: Managed Care, Other (non HMO) | Source: Ambulatory Visit | Attending: Family Medicine | Admitting: Family Medicine

## 2019-12-14 ENCOUNTER — Other Ambulatory Visit: Payer: Self-pay | Admitting: Family Medicine

## 2019-12-14 ENCOUNTER — Encounter (INDEPENDENT_AMBULATORY_CARE_PROVIDER_SITE_OTHER): Payer: Self-pay

## 2019-12-14 DIAGNOSIS — R109 Unspecified abdominal pain: Secondary | ICD-10-CM

## 2019-12-14 MED ORDER — IOPAMIDOL (ISOVUE-300) INJECTION 61%
125.0000 mL | Freq: Once | INTRAVENOUS | Status: AC | PRN
  Administered 2019-12-14: 125 mL via INTRAVENOUS

## 2021-01-28 ENCOUNTER — Other Ambulatory Visit: Payer: Self-pay

## 2021-01-28 ENCOUNTER — Emergency Department (HOSPITAL_BASED_OUTPATIENT_CLINIC_OR_DEPARTMENT_OTHER): Payer: Managed Care, Other (non HMO)

## 2021-01-28 ENCOUNTER — Emergency Department (HOSPITAL_BASED_OUTPATIENT_CLINIC_OR_DEPARTMENT_OTHER)
Admission: EM | Admit: 2021-01-28 | Discharge: 2021-01-28 | Disposition: A | Discharge status: Home / Self Care | Payer: Managed Care, Other (non HMO) | Attending: Emergency Medicine | Admitting: Emergency Medicine

## 2021-01-28 ENCOUNTER — Encounter (HOSPITAL_BASED_OUTPATIENT_CLINIC_OR_DEPARTMENT_OTHER): Payer: Self-pay

## 2021-01-28 DIAGNOSIS — Y92410 Unspecified street and highway as the place of occurrence of the external cause: Secondary | ICD-10-CM | POA: Insufficient documentation

## 2021-01-28 DIAGNOSIS — M25512 Pain in left shoulder: Secondary | ICD-10-CM | POA: Diagnosis present

## 2021-01-28 DIAGNOSIS — F17290 Nicotine dependence, other tobacco product, uncomplicated: Secondary | ICD-10-CM | POA: Diagnosis not present

## 2021-01-28 DIAGNOSIS — M25522 Pain in left elbow: Secondary | ICD-10-CM | POA: Insufficient documentation

## 2021-01-28 DIAGNOSIS — Z23 Encounter for immunization: Secondary | ICD-10-CM | POA: Diagnosis not present

## 2021-01-28 MED ORDER — HYDROCODONE-ACETAMINOPHEN 5-325 MG PO TABS
1.0000 | ORAL_TABLET | Freq: Once | ORAL | Status: AC
  Administered 2021-01-28: 1 via ORAL
  Filled 2021-01-28: qty 1

## 2021-01-28 MED ORDER — TETANUS-DIPHTH-ACELL PERTUSSIS 5-2.5-18.5 LF-MCG/0.5 IM SUSY
0.5000 mL | PREFILLED_SYRINGE | Freq: Once | INTRAMUSCULAR | Status: AC
  Administered 2021-01-28: 0.5 mL via INTRAMUSCULAR
  Filled 2021-01-28: qty 0.5

## 2021-01-28 MED ORDER — KETOROLAC TROMETHAMINE 60 MG/2ML IM SOLN
60.0000 mg | Freq: Once | INTRAMUSCULAR | Status: AC
  Administered 2021-01-28: 60 mg via INTRAMUSCULAR
  Filled 2021-01-28: qty 2

## 2021-01-28 MED ORDER — METHOCARBAMOL 500 MG PO TABS: 500.0000 mg | ORAL_TABLET | Freq: Two times a day (BID) | ORAL | 0 refills | Status: AC

## 2021-01-28 MED ORDER — HYDROCODONE-ACETAMINOPHEN 5-325 MG PO TABS: 2.0000 | ORAL_TABLET | ORAL | 0 refills | Status: AC | PRN
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
## 2021-01-28 NOTE — Discharge Instructions (Addendum)
Your x-ray today was overall reassuring.  Please take up to 800 mg of ibuprofen 3 times daily for about a week, you may use the Norco for breakthrough pain.  You may also use the muscle relaxer for muscular pain, take this at night and do not drink or drive the medication as it can make you sleepy.  Please follow-up with orthopedics if your symptoms continue.  Please return to the ER for any worsening chest pain, abdominal pain,
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
